# Patient Record
Sex: Male | Born: 1950
Health system: Southern US, Community
[De-identification: ages and names within clinical notes are randomized; demographics above are authoritative.]

## PROBLEM LIST (undated history)

## (undated) DIAGNOSIS — J45909 Unspecified asthma, uncomplicated: Secondary | ICD-10-CM

## (undated) DIAGNOSIS — T7840XA Allergy, unspecified, initial encounter: Secondary | ICD-10-CM

## (undated) HISTORY — DX: Unspecified asthma, uncomplicated: J45.909

## (undated) HISTORY — DX: Allergy, unspecified, initial encounter: T78.40XA

## (undated) HISTORY — PX: ROTATOR CUFF REPAIR: SHX139

---

## 2003-11-27 ENCOUNTER — Ambulatory Visit (HOSPITAL_COMMUNITY): Admission: RE | Admit: 2003-11-27 | Discharge: 2003-11-27 | Payer: Self-pay | Admitting: Orthopedic Surgery

## 2004-06-02 ENCOUNTER — Ambulatory Visit: Payer: Self-pay | Admitting: Family Medicine

## 2004-06-18 ENCOUNTER — Ambulatory Visit: Payer: Self-pay | Admitting: Family Medicine

## 2004-06-22 ENCOUNTER — Encounter: Admission: RE | Admit: 2004-06-22 | Discharge: 2004-06-22 | Payer: Self-pay | Admitting: Orthopedic Surgery

## 2007-10-05 ENCOUNTER — Encounter: Admission: RE | Admit: 2007-10-05 | Discharge: 2007-10-05 | Payer: Self-pay

## 2015-12-08 ENCOUNTER — Ambulatory Visit (INDEPENDENT_AMBULATORY_CARE_PROVIDER_SITE_OTHER): Payer: BLUE CROSS/BLUE SHIELD | Admitting: Pediatrics

## 2015-12-08 ENCOUNTER — Encounter: Payer: Self-pay | Admitting: Pediatrics

## 2015-12-08 VITALS — BP 107/67 | HR 71 | Temp 97.0°F | Ht 71.0 in | Wt 190.6 lb

## 2015-12-08 DIAGNOSIS — R42 Dizziness and giddiness: Secondary | ICD-10-CM | POA: Diagnosis not present

## 2015-12-08 DIAGNOSIS — H6123 Impacted cerumen, bilateral: Secondary | ICD-10-CM | POA: Diagnosis not present

## 2015-12-08 NOTE — Progress Notes (Addendum)
  Subjective:   Patient ID: Larry Walls, male    DOB: 1950-12-03, 65 y.o.   MRN: [id number] CC: New Patient (Initial Visit) and Ear Pain (Left)  HPI: Larry Walls is a 65 y.o. male presenting for New Patient (Initial Visit) and Ear Pain (Left)  L ear has been bothering him past 6-7 days Has had cerumen impaction in the past Tried to clean with a qtip Decreased hearing No fevers, has hayfever, allergies, no symptoms  Otherwise feeling well Eats primarily vegetarian diet, rarely has dairy  Sometimes feels lightheaded when he stands up quickly   Past Medical History:  Diagnosis Date  . Allergy   . Asthma    Family History  Problem Relation Age of Onset  . Parkinson's disease Mother    Social History   Social History  . Marital status: Married    Spouse name: N/A  . Number of children: N/A  . Years of education: N/A   Social History Main Topics  . Smoking status: Never Smoker  . Smokeless tobacco: Never Used  . Alcohol use No  . Drug use: No  . Sexual activity: Not Asked   Other Topics Concern  . None   Social History Narrative  . None   ROS: All systems negative other than what is in HPI  Objective:    BP 107/67   Pulse 71   Temp 97 F (36.1 C) (Oral)   Ht '5\' 11"'$  (1.803 m)   Wt 190 lb 9.6 oz (86.5 kg)   BMI 26.58 kg/m   Wt Readings from Last 3 Encounters:  12/08/15 190 lb 9.6 oz (86.5 kg)    Gen: NAD, alert, cooperative with exam, NCAT EYES: EOMI, no conjunctival injection, or no icterus ENT:  TMs with impacted cerumen b/l, OP without erythema LYMPH: no cervical LAD CV: NRRR, normal Y0/V3, II/VI systolic ejection murmur USB, distal pulses 2+ b/l Resp: CTABL, no wheezes, normal WOB Ext: No edema, warm Neuro: Alert and oriented, strength equal b/l UE and LE, coordination grossly normal, no nystagmus MSK: normal muscle bulk  Assessment & Plan:  Larry Walls was seen today for new patient (initial visit) and ear pain.  Diagnoses and all  orders for this visit:  Bilateral impacted cerumen Options discussed, pt wanted to have irrigated Improved after irrigation Use debrox drops several times a week to help remove remainder  Lightheadedness Orthostatics normal Stand slowly Let me know if ongoing -     CMP14+EGFR -     Lipid panel -     CBC with Differential/Platelet   Impacted cerumen removal: After procedure described to patient and patient agreed with proceeding, cerumen impaction was removed from both ears using irrigation. TMs pearly gray following procedure. Pt tolerated procedure well.    Follow up plan: Return in about 2 months (around 02/08/2016) for complete physical exam. Assunta Found, MD [place]

## 2015-12-09 LAB — CBC WITH DIFFERENTIAL/PLATELET
Basophils Absolute: 0.1 10*3/uL (ref 0.0–0.2)
Basos: 1 %
EOS (ABSOLUTE): 0.3 10*3/uL (ref 0.0–0.4)
Eos: 4 %
Hematocrit: 42.6 % (ref 37.5–51.0)
Hemoglobin: 13.9 g/dL (ref 13.0–17.7)
Immature Grans (Abs): 0 10*3/uL (ref 0.0–0.1)
Immature Granulocytes: 0 %
Lymphocytes Absolute: 2.8 10*3/uL (ref 0.7–3.1)
Lymphs: 37 %
MCH: 27.2 pg (ref 26.6–33.0)
MCHC: 32.6 g/dL (ref 31.5–35.7)
MCV: 83 fL (ref 79–97)
Monocytes Absolute: 0.9 10*3/uL (ref 0.1–0.9)
Monocytes: 12 %
Neutrophils Absolute: 3.5 10*3/uL (ref 1.4–7.0)
Neutrophils: 46 %
Platelets: 277 10*3/uL (ref 150–379)
RBC: 5.11 x10E6/uL (ref 4.14–5.80)
RDW: 14.2 % (ref 12.3–15.4)
WBC: 7.5 10*3/uL (ref 3.4–10.8)

## 2015-12-09 LAB — LIPID PANEL
Chol/HDL Ratio: 3 ratio units (ref 0.0–5.0)
Cholesterol, Total: 178 mg/dL (ref 100–199)
HDL: 60 mg/dL (ref >39)
LDL Calculated: 109 mg/dL — ABNORMAL HIGH (ref 0–99)
Triglycerides: 45 mg/dL (ref 0–149)
VLDL Cholesterol Cal: 9 mg/dL (ref 5–40)

## 2015-12-09 LAB — CMP14+EGFR
ALT: 22 IU/L (ref 0–44)
AST: 20 IU/L (ref 0–40)
Albumin/Globulin Ratio: 1.9 (ref 1.2–2.2)
Albumin: 4.5 g/dL (ref 3.6–4.8)
Alkaline Phosphatase: 78 IU/L (ref 39–117)
BUN/Creatinine Ratio: 20 (ref 10–24)
BUN: 19 mg/dL (ref 8–27)
Bilirubin Total: 0.5 mg/dL (ref 0.0–1.2)
CO2: 28 mmol/L (ref 18–29)
Calcium: 9.3 mg/dL (ref 8.6–10.2)
Chloride: 100 mmol/L (ref 96–106)
Creatinine, Ser: 0.94 mg/dL (ref 0.76–1.27)
GFR calc Af Amer: 99 mL/min/{1.73_m2} (ref >59)
GFR calc non Af Amer: 86 mL/min/{1.73_m2} (ref >59)
Globulin, Total: 2.4 g/dL (ref 1.5–4.5)
Glucose: 89 mg/dL (ref 65–99)
Potassium: 4.6 mmol/L (ref 3.5–5.2)
Sodium: 143 mmol/L (ref 134–144)
Total Protein: 6.9 g/dL (ref 6.0–8.5)

## 2015-12-25 ENCOUNTER — Encounter: Payer: Self-pay | Admitting: *Deleted

## 2016-04-08 ENCOUNTER — Encounter: Payer: Self-pay | Admitting: Nurse Practitioner

## 2016-04-08 ENCOUNTER — Ambulatory Visit (INDEPENDENT_AMBULATORY_CARE_PROVIDER_SITE_OTHER): Payer: BLUE CROSS/BLUE SHIELD | Admitting: Nurse Practitioner

## 2016-04-08 VITALS — BP 128/78 | HR 85 | Temp 97.8°F | Ht 71.0 in | Wt 179.0 lb

## 2016-04-08 DIAGNOSIS — Z125 Encounter for screening for malignant neoplasm of prostate: Secondary | ICD-10-CM

## 2016-04-08 DIAGNOSIS — S3991XA Unspecified injury of abdomen, initial encounter: Secondary | ICD-10-CM | POA: Diagnosis not present

## 2016-04-08 DIAGNOSIS — W57XXXA Bitten or stung by nonvenomous insect and other nonvenomous arthropods, initial encounter: Secondary | ICD-10-CM | POA: Diagnosis not present

## 2016-04-08 MED ORDER — DOXYCYCLINE HYCLATE 100 MG PO TABS: 100.0000 mg | ORAL_TABLET | Freq: Two times a day (BID) | ORAL | 0 refills | Status: AC

## 2016-04-08 NOTE — Patient Instructions (Signed)
Tick Bite Information Introduction Ticks are insects that attach themselves to the skin. There are many types of ticks. Common types include wood ticks and deer ticks. Sometimes, ticks carry diseases that can make a person very ill. The most common places for ticks to attach themselves are the scalp, neck, armpits, waist, and groin. HOW CAN YOU PREVENT TICK BITES? Take these steps to help prevent tick bites when you are outdoors:  Wear long sleeves and long pants.  Wear white clothes so you can see ticks more easily.  Tuck your pant legs into your socks.  If walking on a trail, stay in the middle of the trail to avoid brushing against bushes.  Avoid walking through areas with long grass.  Put bug spray on all skin that is showing and along boot tops, pant legs, and sleeve cuffs.  Check clothes, hair, and skin often and before going inside.  Brush off any ticks that are not attached.  Take a shower or bath as soon as possible after being outdoors. HOW SHOULD YOU REMOVE A TICK? Ticks should be removed as soon as possible to help prevent diseases. 1. If latex gloves are available, put them on before trying to remove a tick. 2. Use tweezers to grasp the tick as close to the skin as possible. You may also use curved forceps or a tick removal tool. Grasp the tick as close to its head as possible. Avoid grasping the tick on its body. 3. Pull gently upward until the tick lets go. Do not twist the tick or jerk it suddenly. This may break off the tick's head or mouth parts. 4. Do not squeeze or crush the tick's body. This could force disease-carrying fluids from the tick into your body. 5. After the tick is removed, wash the bite area and your hands with soap and water or alcohol. 6. Apply a small amount of antiseptic cream or ointment to the bite site. 7. Wash any tools that were used. Do not try to remove a tick by applying a hot match, petroleum jelly, or fingernail polish to the tick. These  methods do not work. They may also increase the chances of disease being spread from the tick bite. WHEN SHOULD YOU SEEK HELP? Contact your health care provider if you are unable to remove a tick or if a part of the tick breaks off in the skin. After a tick bite, you need to watch for signs and symptoms of diseases that can be spread by ticks. Contact your health care provider if you develop any of the following:  Fever.  Rash.  Redness and puffiness (swelling) in the area of the tick bite.  Tender, puffy lymph glands.  Watery poop (diarrhea).  Weight loss.  Cough.  Feeling more tired than normal (fatigue).  Muscle, joint, or bone pain.  Belly (abdominal) pain.  Headache.  Change in your level of consciousness.  Trouble walking or moving your legs.  Loss of feeling (numbness) in the legs.  Loss of movement (paralysis).  Shortness of breath.  Confusion.  Throwing up (vomiting) many times. This information is not intended to replace advice given to you by your health care provider. Make sure you discuss any questions you have with your health care provider. Document Released: 03/17/2009 Document Revised: 05/29/2015 Document Reviewed: 05/31/2012 Elsevier Interactive Patient Education  2017 Elsevier Inc.  

## 2016-04-08 NOTE — Progress Notes (Signed)
   Subjective:    Patient ID: Larry Walls, male    DOB: [birth year], 66 y.o.   MRN: [id number]  HPI Patient comes in stating that he was in the woods over the weekend and has gotten to ticks off since then- one in axillary area and the other on left flank.    Review of Systems  Constitutional: Negative.   HENT: Negative.   Respiratory: Negative.   Cardiovascular: Negative.   Genitourinary: Negative.   Neurological: Negative.   Psychiatric/Behavioral: Negative.        Objective:   Physical Exam  Constitutional: He is oriented to person, place, and time. He appears well-developed and well-nourished. No distress.  Cardiovascular: Normal rate and regular rhythm.   Pulmonary/Chest: Effort normal and breath sounds normal.  Neurological: He is alert and oriented to person, place, and time.  Skin: Skin is warm. No rash noted.  2 erythematous bite marks- 1 on left axillary area and 1 left flank- no bullseye appearance  Psychiatric: He has a normal mood and affect. His behavior is normal. Judgment and thought content normal.    BP 128/78   Pulse 85   Temp 97.8 F (36.6 C) (Oral)   Ht  (1.803 m)   Wt 179 lb (81.2 kg)   BMI 24.97 kg/m      Assessment & Plan:  1. Tick bite, initial encounter Do not pick or scratch area RTO prn - doxycycline (VIBRA-TABS) 100 MG tablet; Take 1 tablet (100 mg total) by mouth 2 (two) times daily. 1 po bid  Dispense: 28 tablet; Refill: 0  2. Prostate cancer screening Patient request - PSA, total and free  Mary-Margaret Daphine Deutscher, FNP

## 2016-04-12 ENCOUNTER — Ambulatory Visit (INDEPENDENT_AMBULATORY_CARE_PROVIDER_SITE_OTHER): Payer: BLUE CROSS/BLUE SHIELD | Admitting: Nurse Practitioner

## 2016-04-12 ENCOUNTER — Ambulatory Visit (INDEPENDENT_AMBULATORY_CARE_PROVIDER_SITE_OTHER): Payer: BLUE CROSS/BLUE SHIELD

## 2016-04-12 ENCOUNTER — Encounter: Payer: Self-pay | Admitting: Nurse Practitioner

## 2016-04-12 VITALS — BP 129/65 | HR 83 | Temp 97.5°F | Ht 71.0 in | Wt 180.0 lb

## 2016-04-12 DIAGNOSIS — Z0000 Encounter for general adult medical examination without abnormal findings: Secondary | ICD-10-CM | POA: Diagnosis not present

## 2016-04-12 DIAGNOSIS — Z1211 Encounter for screening for malignant neoplasm of colon: Secondary | ICD-10-CM

## 2016-04-12 DIAGNOSIS — Z1212 Encounter for screening for malignant neoplasm of rectum: Secondary | ICD-10-CM

## 2016-04-12 DIAGNOSIS — Z1159 Encounter for screening for other viral diseases: Secondary | ICD-10-CM

## 2016-04-12 LAB — URINALYSIS
Bilirubin, UA: NEGATIVE
Glucose, UA: NEGATIVE
Leukocytes, UA: NEGATIVE
Nitrite, UA: NEGATIVE
Protein, UA: NEGATIVE
RBC, UA: NEGATIVE
Specific Gravity, UA: 1.025 (ref 1.005–1.030)
Urobilinogen, Ur: 0.2 mg/dL (ref 0.2–1.0)
pH, UA: 6 (ref 5.0–7.5)

## 2016-04-12 NOTE — Progress Notes (Signed)
   Subjective:    Patient ID: Larry Walls, male    DOB: Sep 13, 1950, 66 y.o.   MRN: [id number]  HPI Patient comes in today for annual physical exam. He has no current medical problems and is on no meds. He was last seen last week wth tick bite and is currently taking doxycycline prophylactically. He is doing well today without complaints.    Review of Systems  Constitutional: Negative for diaphoresis.  Eyes: Negative for pain.  Respiratory: Negative for shortness of breath.   Cardiovascular: Negative for chest pain, palpitations and leg swelling.  Gastrointestinal: Negative for abdominal pain.  Endocrine: Negative for polydipsia.  Skin: Negative for rash.  Neurological: Negative for dizziness, weakness and headaches.  Hematological: Does not bruise/bleed easily.  All other systems reviewed and are negative.      Objective:   Physical Exam  Constitutional: He is oriented to person, place, and time. He appears well-developed and well-nourished.  HENT:  Head: Normocephalic.  Right Ear: External ear normal.  Left Ear: External ear normal.  Nose: Nose normal.  Mouth/Throat: Oropharynx is clear and moist.  Eyes: EOM are normal. Pupils are equal, round, and reactive to light.  Neck: Normal range of motion. Neck supple. No JVD present. No thyromegaly present.  Cardiovascular: Normal rate, regular rhythm, normal heart sounds and intact distal pulses.  Exam reveals no gallop and no friction rub.   No murmur heard. Pulmonary/Chest: Effort normal and breath sounds normal. No respiratory distress. He has no wheezes. He has no rales. He exhibits no tenderness.  Abdominal: Soft. Bowel sounds are normal. He exhibits no mass. There is no tenderness.  Genitourinary:  Genitourinary Comments: Refused rectal exam  Musculoskeletal: Normal range of motion. He exhibits no edema.  Lymphadenopathy:    He has no cervical adenopathy.  Neurological: He is alert and oriented to person, place, and  time. No cranial nerve deficit.  Skin: Skin is warm and dry.  Psychiatric: He has a normal mood and affect. His behavior is normal. Judgment and thought content normal.   BP 129/65   Pulse 83   Temp 97.5 F (36.4 C) (Oral)   Ht _0  (1.803 m)   Wt 180 lb (81.6 kg)   BMI 25.10 kg/m   Chest xray- negative for cardiopulmonary problems-Preliminary reading by Ronnald Collum, FNP  [facility]  EKG- NSR    Assessment & Plan:  1. Annual physical exam - DG Chest 2 View; Future - EKG 12-Lead - CMP14+EGFR - CBC with Differential/Platelet - Lipid panel - PSA, total and free - Urinalysis  2. Encounter for colorectal cancer screening - Fecal occult blood, imunochemical; Future  3. Need for hepatitis C screening test - Hepatitis C antibody    Labs pending Health maintenance reviewed Diet and exercise encouraged Continue all meds Follow up  In 1 year [place], FNP

## 2016-04-12 NOTE — Patient Instructions (Signed)
 Health Maintenance, Male A healthy lifestyle and preventive care is important for your health and wellness. Ask your health care provider about what schedule of regular examinations is right for you. What should I know about weight and diet?  Eat a Healthy Diet  Eat plenty of vegetables, fruits, whole grains, low-fat dairy products, and lean protein.  Do not eat a lot of foods high in solid fats, added sugars, or salt. Maintain a Healthy Weight  Regular exercise can help you achieve or maintain a healthy weight. You should:  Do at least 150 minutes of exercise each week. The exercise should increase your heart rate and make you sweat (moderate-intensity exercise).  Do strength-training exercises at least twice a week. Watch Your Levels of Cholesterol and Blood Lipids  Have your blood tested for lipids and cholesterol every 5 years starting at 64 years of age. If you are at high risk for heart disease, you should start having your blood tested when you are 64 years old. You may need to have your cholesterol levels checked more often if:  Your lipid or cholesterol levels are high.  You are older than 64 years of age.  You are at high risk for heart disease. What should I know about cancer screening? Many types of cancers can be detected early and may often be prevented. Lung Cancer  You should be screened every year for lung cancer if:  You are a current smoker who has smoked for at least 30 years.  You are a former smoker who has quit within the past 15 years.  Talk to your health care provider about your screening options, when you should start screening, and how often you should be screened. Colorectal Cancer  Routine colorectal cancer screening usually begins at 64 years of age and should be repeated every 5-10 years until you are 64 years old. You may need to be screened more often if early forms of precancerous polyps or small growths are found. Your health care provider  may recommend screening at an earlier age if you have risk factors for colon cancer.  Your health care provider may recommend using home test kits to check for hidden blood in the stool.  A small camera at the end of a tube can be used to examine your colon (sigmoidoscopy or colonoscopy). This checks for the earliest forms of colorectal cancer. Prostate and Testicular Cancer  Depending on your age and overall health, your health care provider may do certain tests to screen for prostate and testicular cancer.  Talk to your health care provider about any symptoms or concerns you have about testicular or prostate cancer. Skin Cancer  Check your skin from head to toe regularly.  Tell your health care provider about any new moles or changes in moles, especially if:  There is a change in a mole's size, shape, or color.  You have a mole that is larger than a pencil eraser.  Always use sunscreen. Apply sunscreen liberally and repeat throughout the day.  Protect yourself by wearing long sleeves, pants, a wide-brimmed hat, and sunglasses when outside. What should I know about heart disease, diabetes, and high blood pressure?  If you are 18-39 years of age, have your blood pressure checked every 3-5 years. If you are 40 years of age or older, have your blood pressure checked every year. You should have your blood pressure measured twice-once when you are at a hospital or clinic, and once when you are not at   a hospital or clinic. Record the average of the two measurements. To check your blood pressure when you are not at a hospital or clinic, you can use:  An automated blood pressure machine at a pharmacy.  A home blood pressure monitor.  Talk to your health care provider about your target blood pressure.  If you are between 45-79 years old, ask your health care provider if you should take aspirin to prevent heart disease.  Have regular diabetes screenings by checking your fasting blood sugar  level.  If you are at a normal weight and have a low risk for diabetes, have this test once every three years after the age of 45.  If you are overweight and have a high risk for diabetes, consider being tested at a younger age or more often.  A one-time screening for abdominal aortic aneurysm (AAA) by ultrasound is recommended for men aged 65-75 years who are current or former smokers. What should I know about preventing infection? Hepatitis B  If you have a higher risk for hepatitis B, you should be screened for this virus. Talk with your health care provider to find out if you are at risk for hepatitis B infection. Hepatitis C  Blood testing is recommended for:  Everyone born from 1945 through 1965.  Anyone with known risk factors for hepatitis C. Sexually Transmitted Diseases (STDs)  You should be screened each year for STDs including gonorrhea and chlamydia if:  You are sexually active and are younger than 64 years of age.  You are older than 64 years of age and your health care provider tells you that you are at risk for this type of infection.  Your sexual activity has changed since you were last screened and you are at an increased risk for chlamydia or gonorrhea. Ask your health care provider if you are at risk.  Talk with your health care provider about whether you are at high risk of being infected with HIV. Your health care provider may recommend a prescription medicine to help prevent HIV infection. What else can I do?  Schedule regular health, dental, and eye exams.  Stay current with your vaccines (immunizations).  Do not use any tobacco products, such as cigarettes, chewing tobacco, and e-cigarettes. If you need help quitting, ask your health care provider.  Limit alcohol intake to no more than 2 drinks per day. One drink equals 12 ounces of beer, 5 ounces of wine, or 1 ounces of hard liquor.  Do not use street drugs.  Do not share needles.  Ask your health  care provider for help if you need support or information about quitting drugs.  Tell your health care provider if you often feel depressed.  Tell your health care provider if you have ever been abused or do not feel safe at home. This information is not intended to replace advice given to you by your health care provider. Make sure you discuss any questions you have with your health care provider. Document Released: 06/19/2007 Document Revised: 08/20/2015 Document Reviewed: 09/24/2014 Elsevier Interactive Patient Education  2017 Elsevier Inc.  

## 2016-04-13 ENCOUNTER — Other Ambulatory Visit: Payer: BLUE CROSS/BLUE SHIELD

## 2016-04-13 DIAGNOSIS — Z1211 Encounter for screening for malignant neoplasm of colon: Secondary | ICD-10-CM

## 2016-04-13 DIAGNOSIS — Z1212 Encounter for screening for malignant neoplasm of rectum: Principal | ICD-10-CM

## 2016-04-13 LAB — PSA, TOTAL AND FREE
PSA, Free Pct: 27.7 %
PSA, Free: 0.36 ng/mL
Prostate Specific Ag, Serum: 1.3 ng/mL (ref 0.0–4.0)

## 2016-04-13 LAB — LIPID PANEL
Chol/HDL Ratio: 2.6 ratio (ref 0.0–5.0)
Cholesterol, Total: 150 mg/dL (ref 100–199)
HDL: 57 mg/dL (ref >39)
LDL Calculated: 81 mg/dL (ref 0–99)
Triglycerides: 62 mg/dL (ref 0–149)
VLDL Cholesterol Cal: 12 mg/dL (ref 5–40)

## 2016-04-13 LAB — CMP14+EGFR
ALT: 17 IU/L (ref 0–44)
AST: 20 IU/L (ref 0–40)
Albumin/Globulin Ratio: 2.2 (ref 1.2–2.2)
Albumin: 4.4 g/dL (ref 3.6–4.8)
Alkaline Phosphatase: 74 IU/L (ref 39–117)
BUN/Creatinine Ratio: 24 (ref 10–24)
BUN: 24 mg/dL (ref 8–27)
Bilirubin Total: 0.3 mg/dL (ref 0.0–1.2)
CO2: 27 mmol/L (ref 18–29)
Calcium: 9.2 mg/dL (ref 8.6–10.2)
Chloride: 104 mmol/L (ref 96–106)
Creatinine, Ser: 1.01 mg/dL (ref 0.76–1.27)
GFR calc Af Amer: 91 mL/min/{1.73_m2} (ref >59)
GFR calc non Af Amer: 79 mL/min/{1.73_m2} (ref >59)
Globulin, Total: 2 g/dL (ref 1.5–4.5)
Glucose: 104 mg/dL — ABNORMAL HIGH (ref 65–99)
Potassium: 4 mmol/L (ref 3.5–5.2)
Sodium: 145 mmol/L — ABNORMAL HIGH (ref 134–144)
Total Protein: 6.4 g/dL (ref 6.0–8.5)

## 2016-04-13 LAB — CBC WITH DIFFERENTIAL/PLATELET
Basophils Absolute: 0 10*3/uL (ref 0.0–0.2)
Basos: 0 %
EOS (ABSOLUTE): 0.2 10*3/uL (ref 0.0–0.4)
Eos: 3 %
Hematocrit: 39.2 % (ref 37.5–51.0)
Hemoglobin: 12.8 g/dL — ABNORMAL LOW (ref 13.0–17.7)
Immature Grans (Abs): 0 10*3/uL (ref 0.0–0.1)
Immature Granulocytes: 0 %
Lymphocytes Absolute: 2.5 10*3/uL (ref 0.7–3.1)
Lymphs: 36 %
MCH: 27.1 pg (ref 26.6–33.0)
MCHC: 32.7 g/dL (ref 31.5–35.7)
MCV: 83 fL (ref 79–97)
Monocytes Absolute: 0.6 10*3/uL (ref 0.1–0.9)
Monocytes: 9 %
Neutrophils Absolute: 3.5 10*3/uL (ref 1.4–7.0)
Neutrophils: 52 %
Platelets: 243 10*3/uL (ref 150–379)
RBC: 4.72 x10E6/uL (ref 4.14–5.80)
RDW: 13.5 % (ref 12.3–15.4)
WBC: 6.9 10*3/uL (ref 3.4–10.8)

## 2016-04-13 LAB — HEPATITIS C ANTIBODY: Hep C Virus Ab: <0.1 s/co ratio (ref 0.0–0.9)

## 2016-04-15 LAB — FECAL OCCULT BLOOD, IMMUNOCHEMICAL: Fecal Occult Bld: NEGATIVE

## 2017-03-11 ENCOUNTER — Ambulatory Visit (INDEPENDENT_AMBULATORY_CARE_PROVIDER_SITE_OTHER): Payer: BLUE CROSS/BLUE SHIELD

## 2017-03-11 ENCOUNTER — Encounter (INDEPENDENT_AMBULATORY_CARE_PROVIDER_SITE_OTHER): Payer: Self-pay | Admitting: Orthopaedic Surgery

## 2017-03-11 ENCOUNTER — Ambulatory Visit (INDEPENDENT_AMBULATORY_CARE_PROVIDER_SITE_OTHER): Payer: BLUE CROSS/BLUE SHIELD | Admitting: Orthopaedic Surgery

## 2017-03-11 DIAGNOSIS — M25571 Pain in right ankle and joints of right foot: Secondary | ICD-10-CM | POA: Diagnosis not present

## 2017-03-11 NOTE — Progress Notes (Signed)
   Office Visit Note   Patient: Larry Walls           Date of Birth: 05-05-1950           MRN: [id number] Visit Date: 03/11/2017              Requested by: Johna SheriffVincent, Carol L, MD [address] [place], [place] [id number] PCP: Johna SheriffVincent, Carol L, MD   Assessment & Plan: Visit Diagnoses:  1. Pain in right ankle and joints of right foot     Plan: Impression is minimally displaced right lateral talar process fracture.  ASO brace was given today.  Recommend wearing this for 3-4 weeks and then wean as tolerated.  Overall he is ambulating well.  Symptomatic treatment as needed.  Follow-up as needed.  Follow-Up Instructions: Return if symptoms worsen or fail to improve.   Orders:  Orders Placed This Encounter  Procedures  . XR Ankle Complete Right   No orders of the defined types were placed in this encounter.     Procedures: No procedures performed   Clinical Data: No additional findings.   Subjective: Chief Complaint  Patient presents with  . Right Ankle - Pain    Patient is 67 year old gentleman who sustained a right ankle injury earlier this week while at work.  He sustained a lateral talar process avulsion fracture.  He was placed in a Cam walker.  He is instructed to follow-up with an orthopedist.He denies any numbness and tingling    Review of Systems  Constitutional: Negative.   All other systems reviewed and are negative.    Objective: Vital Signs: There were no vitals taken for this visit.  Physical Exam  Constitutional: He is oriented to person, place, and time. He appears well-developed and well-nourished.  HENT:  Head: Normocephalic and atraumatic.  Eyes: Pupils are equal, round, and reactive to light.  Neck: Neck supple.  Pulmonary/Chest: Effort normal.  Abdominal: Soft.  Musculoskeletal: Normal range of motion.  Neurological: He is alert and oriented to person, place, and time.  Skin: Skin is warm.  Psychiatric: He has a normal mood and affect.  His behavior is normal. Judgment and thought content normal.  Nursing note and vitals reviewed.   Ortho Exam Right ankle exam shows minimal swelling.  There is mild tenderness over the lateral talar process. Specialty Comments:  No specialty comments available.  Imaging: Xr Ankle Complete Right  Result Date: 03/11/2017 Minimally displaced avulsion fracture of lateral talar process    PMFS History: There are no active problems to display for this patient.  Past Medical History:  Diagnosis Date  . Allergy   . Asthma     Family History  Problem Relation Age of Onset  . Parkinson's disease Mother     Past Surgical History:  Procedure Laterality Date  . ROTATOR CUFF REPAIR     Social History   Occupational History  . Not on file  Tobacco Use  . Smoking status: Never Smoker  . Smokeless tobacco: Never Used  Substance and Sexual Activity  . Alcohol use: No  . Drug use: No  . Sexual activity: Not on file

## 2018-02-04 IMAGING — DX DG CHEST 2V
2 series · 2 of 2 positions shown · non-contrast
Comparison: None.

CLINICAL DATA: Routine physical

EXAM:
CHEST  2 VIEW

[chest pa]
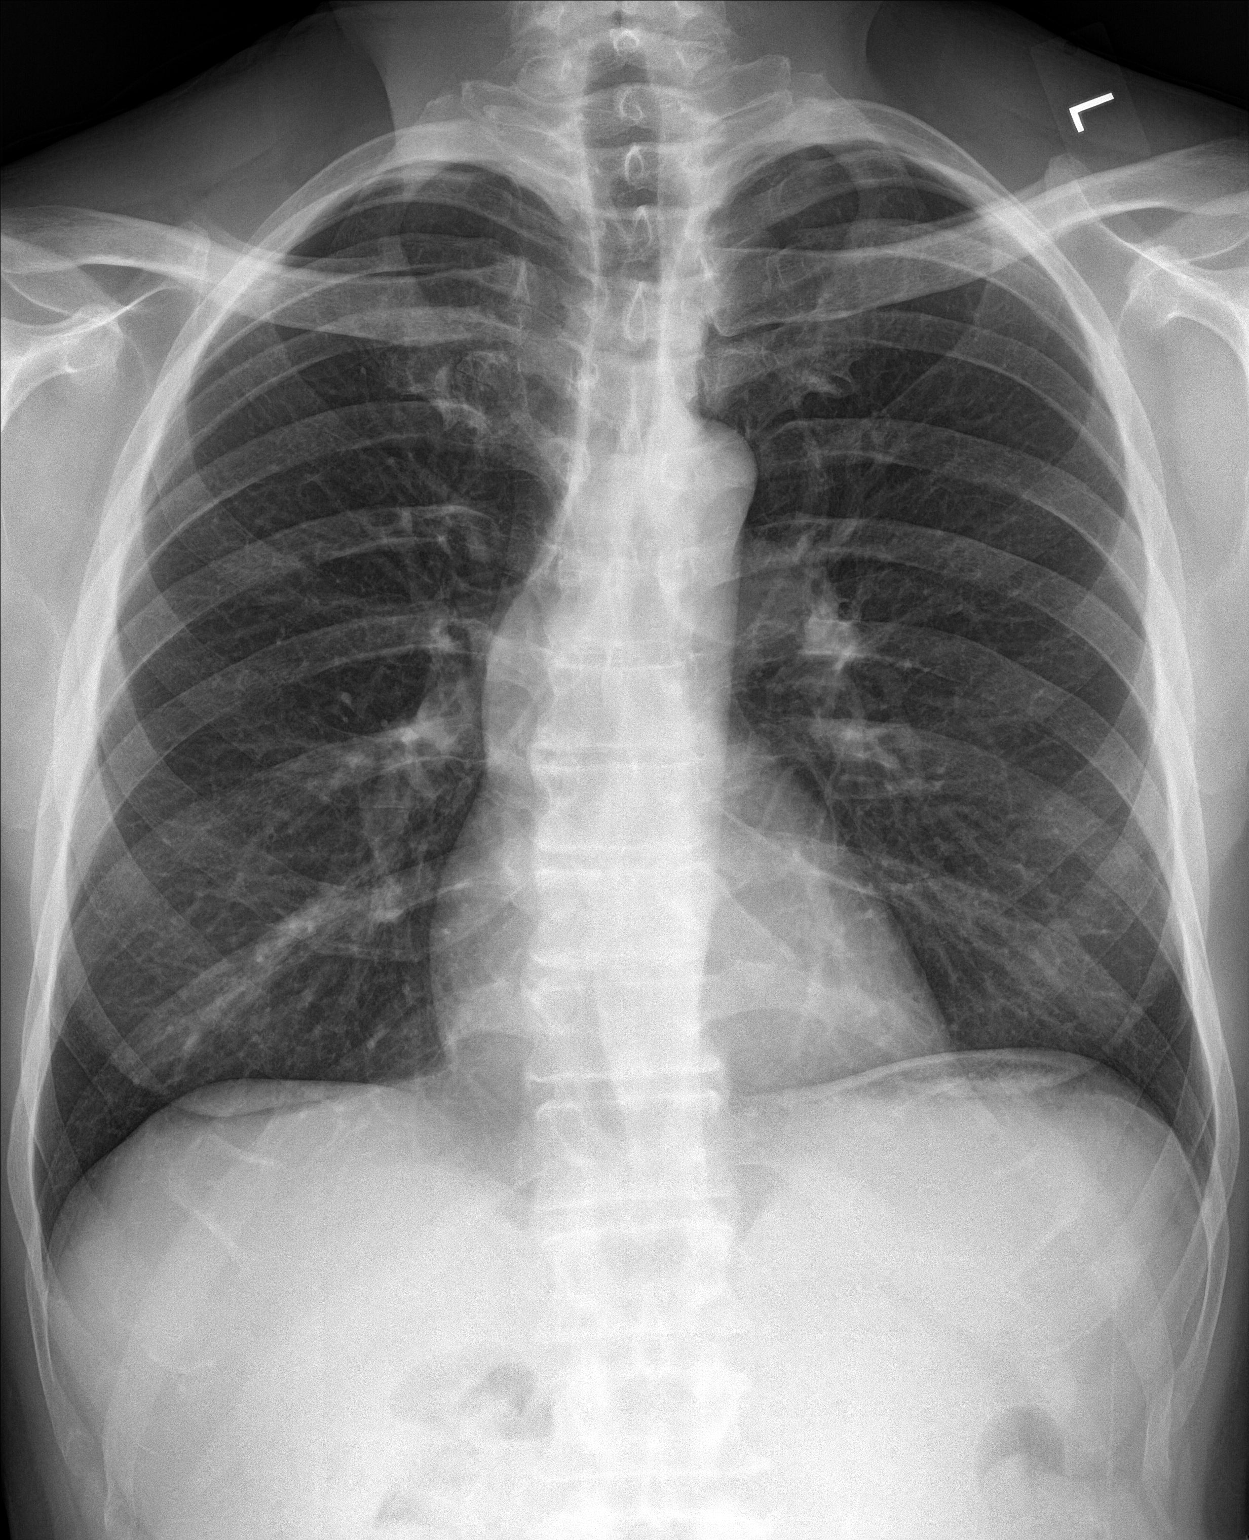

[chest lat]
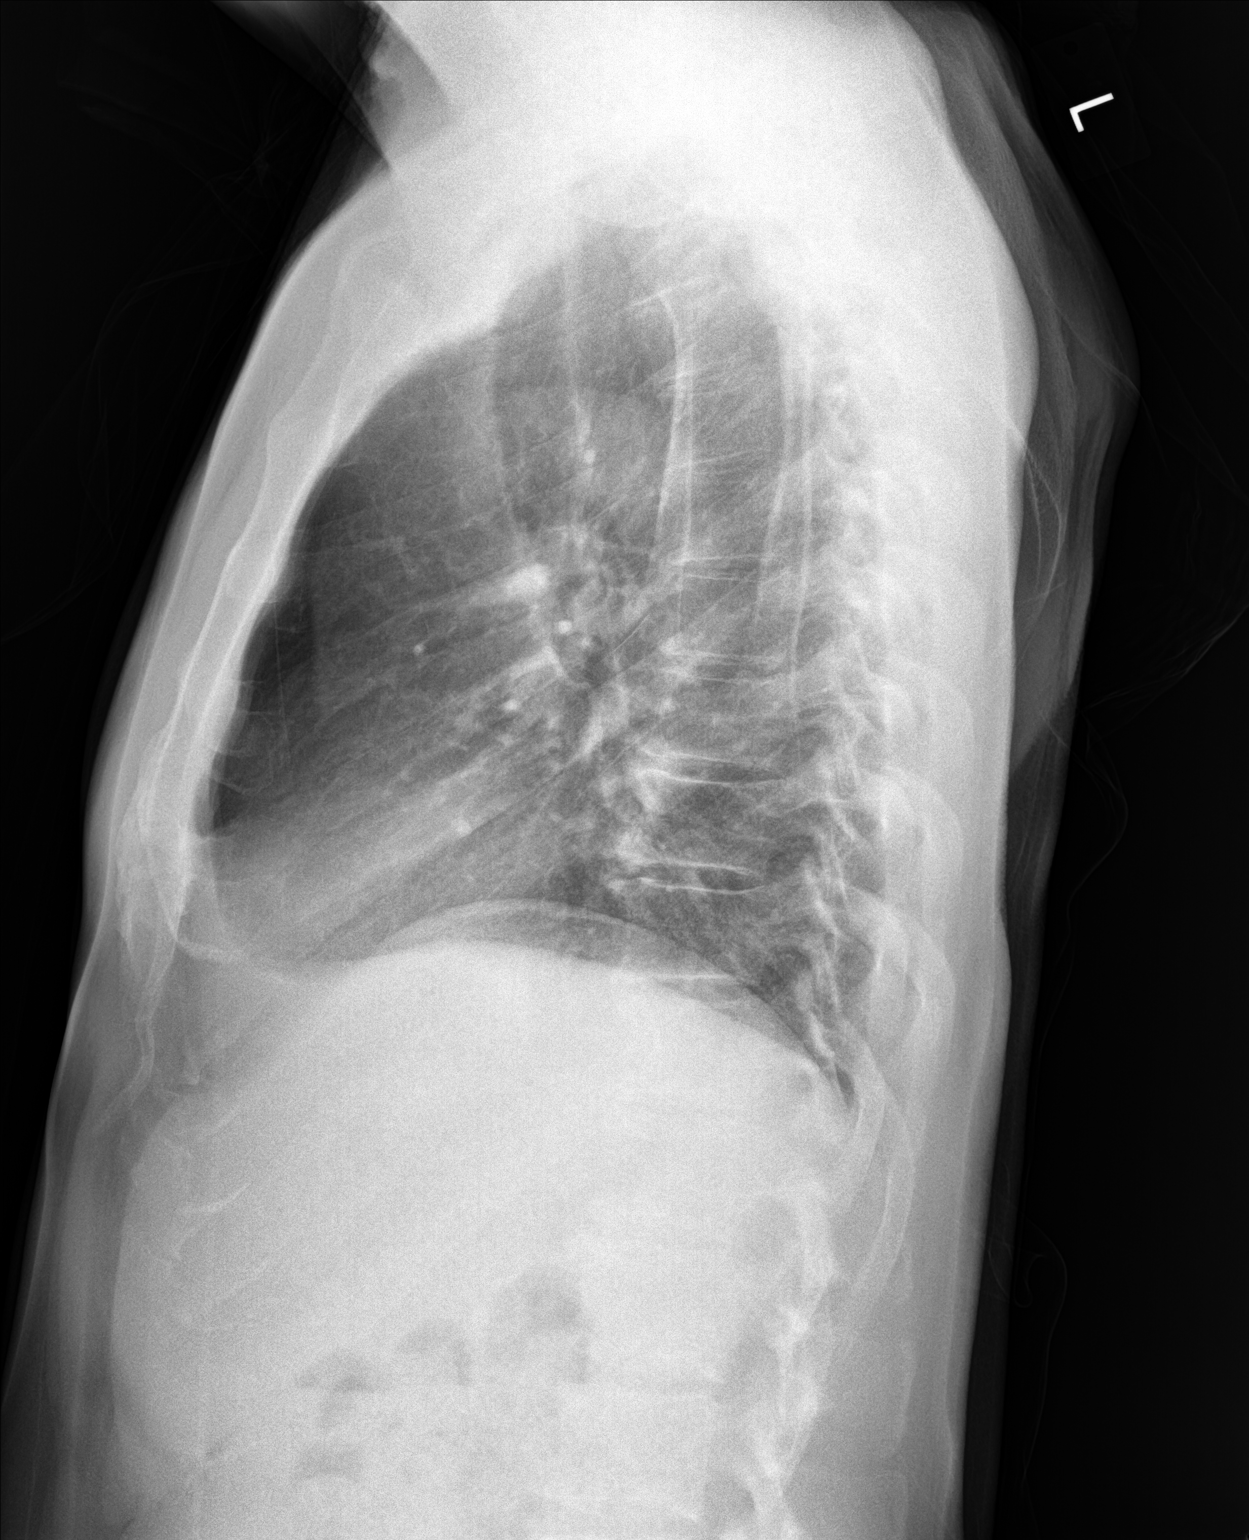

[2 of 2 positions shown; findings below may reference images not displayed]

FINDINGS: Heart and mediastinal contours are within normal limits. No focal
opacities or effusions. No acute bony abnormality.
IMPRESSION: No active cardiopulmonary disease.

## 2024-01-24 ENCOUNTER — Other Ambulatory Visit: Payer: Self-pay

## 2024-01-24 ENCOUNTER — Encounter: Payer: Self-pay | Admitting: Physical Therapy

## 2024-01-24 ENCOUNTER — Ambulatory Visit: Payer: Self-pay | Attending: Family Medicine | Admitting: Physical Therapy

## 2024-01-24 DIAGNOSIS — M25612 Stiffness of left shoulder, not elsewhere classified: Secondary | ICD-10-CM | POA: Diagnosis present

## 2024-01-24 DIAGNOSIS — M25512 Pain in left shoulder: Secondary | ICD-10-CM | POA: Diagnosis present

## 2024-01-24 DIAGNOSIS — G8929 Other chronic pain: Secondary | ICD-10-CM | POA: Diagnosis present

## 2024-01-24 NOTE — Therapy (Signed)
 " OUTPATIENT PHYSICAL THERAPY SHOULDER EVALUATION   Patient Name: Larry Walls MRN: [id number] DOB:May 07, 1950, 74 y.o., male Today's Date: 01/24/2024  END OF SESSION:  PT End of Session - 01/24/24 1133     Visit Number 1    Number of Visits 12    Date for Recertification  03/06/24    PT Start Time 0930    PT Stop Time 1017    PT Time Calculation (min) 47 min    Activity Tolerance Patient tolerated treatment well    Behavior During Therapy WFL for tasks assessed/performed          Past Medical History:  Diagnosis Date   Allergy    Asthma    Past Surgical History:  Procedure Laterality Date   ROTATOR CUFF REPAIR     There are no active problems to display for this patient.   REFERRING PROVIDER: Evalene Citron DO  REFERRING DIAG: Internal impingement of left shoulder.    THERAPY DIAG:  Chronic left shoulder pain  Stiffness of left shoulder, not elsewhere classified  Rationale for Evaluation and Treatment: Rehabilitation  ONSET DATE: ~6 months.    SUBJECTIVE:                                                                                                                                                                                      SUBJECTIVE STATEMENT: The patient presents to the clinic with c/o left shoulder pain that has been ongoing for about 6 months.  His pain-level today is 5/10 and can increase with movement. He notes a loss of motion across his body.  He describes the pain as sharp.    PERTINENT HISTORY: Right RTC repair.    PAIN:  Are you having pain? Yes: NPRS scale: 5/10.   Pain location: Left shoulder.   Pain description: As above.   Aggravating factors: As above. Relieving factors: As above.    PRECAUTIONS: None  RED FLAGS: None   WEIGHT BEARING RESTRICTIONS: No  FALLS:  Has patient fallen in last 6 months? Yes. Number of falls 1.    LIVING ENVIRONMENT: Lives in: House/apartment Has following equipment at home:  None  OCCUPATION: Does physical work for events.    PLOF: Independent  PATIENT GOALS:  Use left shoulder.    NEXT MD VISIT:   OBJECTIVE:   PATIENT SURVEYS:  QUICKDASH:  22 points (25%).    UPPER EXTREMITY ROM:   140 degrees of left shoulder active flexion and ER to 80 degrees.  Horizontal adduction and behind back less than contralaterally compared.    UPPER EXTREMITY MMT:  IR is normal.  ER is 4 to 4+/5.  Deltoid strength is 4+/5.    SHOULDER SPECIAL TESTS: Some pain reproduction with left shoulder impingement testing.  (-) Drop arm test.    PALPATION:  Mild posterior cuff tenderness.                                                                                                                               TREATMENT DATE: 01/24/24: HMP and IFC at 80-150 Hz on 40% scan 15 minutes.  Normal modality response following removal of modality.       PATIENT EDUCATION: Education details: Information on how to obtain a TENS unit.   Person educated: Patient Education method: Explanation Education comprehension: verbalized understanding and returned demonstration  HOME EXERCISE PROGRAM:   ASSESSMENT:  CLINICAL IMPRESSION: The patient presents to the [facility] with c/o left shoulder pain that has been ongoing for about 6 months.  He had some pain reproduction with a left shoulder Impingement test.  He lacks some range of motion per contralateral comparison.  She has some weakness into ER.  His QUICKDASH score is 22 points (25%).  He has some tenderness in is left posterior cuff region.  Patient will benefit from skilled physical therapy intervention to address pain and deficits.    OBJECTIVE IMPAIRMENTS: decreased activity tolerance, decreased ROM, decreased strength, increased muscle spasms, and pain.   ACTIVITY LIMITATIONS: carrying, lifting, and reach over head  PARTICIPATION LIMITATIONS: meal prep, cleaning, laundry, and yard work  PERSONAL FACTORS: Time since onset of  injury/illness/exacerbation are also affecting patient's functional outcome.   REHAB POTENTIAL: Good  CLINICAL DECISION MAKING: Evolving/moderate complexity  EVALUATION COMPLEXITY: Low   GOALS:  SHORT TERM GOALS: Target date: 02/07/24  Ind with an initial HEP. Goal status: INITIAL   LONG TERM GOALS: Target date: 03/06/24  Ind with an advanced HEP.  Goal status: INITIAL  2.  Active shoulder flexion to 155 degrees so the patient can easily reach overhead. Goal status: INITIAL  3.  Perform ADL's with pain not > 3/10.  Goal status: INITIAL  4.  Improve QUICKDASH by points.  Goal status: INITIAL  PLAN:  PT FREQUENCY: 2x/week  PT DURATION: 6 weeks  PLANNED INTERVENTIONS: 97110-Therapeutic exercises, 97530- Therapeutic activity, [id number]- Neuromuscular re-education, 97535- Self Care, [id number]- Manual therapy, [id number]- Electrical stimulation (unattended), [id number]- Vasopneumatic device, 97035- Ultrasound, Patient/Family education, Cryotherapy, and Moist heat  PLAN FOR NEXT SESSION: Pulleys, UE Ranger, RW4, PRE's.  Modalities and STW/M as needed.     Shaunta Oncale, PT 01/24/2024, 11:51 AM  "

## 2024-01-30 ENCOUNTER — Ambulatory Visit: Admitting: *Deleted

## 2024-02-01 ENCOUNTER — Ambulatory Visit

## 2024-02-03 ENCOUNTER — Ambulatory Visit: Admitting: *Deleted
